# Patient Record
Sex: Male | Born: 1970 | Race: White | Hispanic: No | Marital: Married | Smoking: Current every day smoker
Health system: Southern US, Community
[De-identification: ages and names within clinical notes are randomized; demographics above are authoritative.]

## PROBLEM LIST (undated history)

## (undated) DIAGNOSIS — F4310 Post-traumatic stress disorder, unspecified: Secondary | ICD-10-CM

## (undated) DIAGNOSIS — M5134 Other intervertebral disc degeneration, thoracic region: Secondary | ICD-10-CM

## (undated) DIAGNOSIS — F909 Attention-deficit hyperactivity disorder, unspecified type: Secondary | ICD-10-CM

## (undated) DIAGNOSIS — M5136 Other intervertebral disc degeneration, lumbar region: Secondary | ICD-10-CM

## (undated) DIAGNOSIS — E079 Disorder of thyroid, unspecified: Secondary | ICD-10-CM

## (undated) DIAGNOSIS — G8929 Other chronic pain: Secondary | ICD-10-CM

## (undated) DIAGNOSIS — F319 Bipolar disorder, unspecified: Secondary | ICD-10-CM

## (undated) HISTORY — PX: BACK SURGERY: SHX140

## (undated) HISTORY — PX: KNEE SURGERY: SHX244

---

## 2013-02-13 ENCOUNTER — Encounter (HOSPITAL_COMMUNITY): Payer: Self-pay | Admitting: Emergency Medicine

## 2013-02-13 ENCOUNTER — Emergency Department (HOSPITAL_COMMUNITY)
Admission: EM | Admit: 2013-02-13 | Discharge: 2013-02-13 | Disposition: A | Discharge status: Home / Self Care | Payer: Worker's Compensation | Attending: Emergency Medicine | Admitting: Emergency Medicine

## 2013-02-13 DIAGNOSIS — G8929 Other chronic pain: Secondary | ICD-10-CM

## 2013-02-13 DIAGNOSIS — M549 Dorsalgia, unspecified: Secondary | ICD-10-CM | POA: Insufficient documentation

## 2013-02-13 DIAGNOSIS — F17200 Nicotine dependence, unspecified, uncomplicated: Secondary | ICD-10-CM | POA: Insufficient documentation

## 2013-02-13 DIAGNOSIS — M542 Cervicalgia: Secondary | ICD-10-CM | POA: Insufficient documentation

## 2013-02-13 DIAGNOSIS — R52 Pain, unspecified: Secondary | ICD-10-CM | POA: Insufficient documentation

## 2013-02-13 DIAGNOSIS — Z79899 Other long term (current) drug therapy: Secondary | ICD-10-CM | POA: Insufficient documentation

## 2013-02-13 DIAGNOSIS — Z880 Allergy status to penicillin: Secondary | ICD-10-CM | POA: Insufficient documentation

## 2013-02-13 HISTORY — DX: Other chronic pain: G89.29

## 2013-02-13 MED ORDER — OXYCODONE-ACETAMINOPHEN 5-325 MG PO TABS
2.0000 | ORAL_TABLET | Freq: Once | ORAL | Status: AC
  Administered 2013-02-13: 2 via ORAL
  Filled 2013-02-13: qty 2

## 2013-02-13 MED ORDER — IBUPROFEN 600 MG PO TABS: 600.0000 mg | ORAL_TABLET | Freq: Four times a day (QID) | ORAL | Status: DC | PRN

## 2013-02-13 MED ORDER — KETOROLAC TROMETHAMINE 60 MG/2ML IM SOLN
60.0000 mg | Freq: Once | INTRAMUSCULAR | Status: AC
  Administered 2013-02-13: 60 mg via INTRAMUSCULAR
  Filled 2013-02-13: qty 2

## 2013-02-13 MED ORDER — OXYCODONE-ACETAMINOPHEN 5-325 MG PO TABS: 2.0000 | ORAL_TABLET | ORAL | Status: DC | PRN

## 2013-02-13 MED ORDER — METHOCARBAMOL 500 MG PO TABS
500.0000 mg | ORAL_TABLET | Freq: Once | ORAL | Status: AC
  Administered 2013-02-13: 500 mg via ORAL
  Filled 2013-02-13: qty 1

## 2013-02-13 MED ORDER — METHOCARBAMOL 500 MG PO TABS: 500.0000 mg | ORAL_TABLET | Freq: Two times a day (BID) | ORAL | Status: DC
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
## 2013-02-13 NOTE — ED Notes (Signed)
Pt c/o lower back pain with pain in right hip and knee all chronic in nature

## 2013-04-11 ENCOUNTER — Emergency Department (HOSPITAL_COMMUNITY)
Admission: EM | Admit: 2013-04-11 | Discharge: 2013-04-11 | Disposition: A | Discharge status: Home / Self Care | Payer: Worker's Compensation | Attending: Emergency Medicine | Admitting: Emergency Medicine

## 2013-04-11 ENCOUNTER — Encounter (HOSPITAL_COMMUNITY): Payer: Self-pay | Admitting: Emergency Medicine

## 2013-04-11 DIAGNOSIS — Z880 Allergy status to penicillin: Secondary | ICD-10-CM | POA: Insufficient documentation

## 2013-04-11 DIAGNOSIS — R209 Unspecified disturbances of skin sensation: Secondary | ICD-10-CM | POA: Insufficient documentation

## 2013-04-11 DIAGNOSIS — M5416 Radiculopathy, lumbar region: Secondary | ICD-10-CM

## 2013-04-11 DIAGNOSIS — G8921 Chronic pain due to trauma: Secondary | ICD-10-CM | POA: Insufficient documentation

## 2013-04-11 DIAGNOSIS — F17200 Nicotine dependence, unspecified, uncomplicated: Secondary | ICD-10-CM | POA: Insufficient documentation

## 2013-04-11 DIAGNOSIS — M546 Pain in thoracic spine: Secondary | ICD-10-CM | POA: Insufficient documentation

## 2013-04-11 DIAGNOSIS — IMO0002 Reserved for concepts with insufficient information to code with codable children: Secondary | ICD-10-CM | POA: Insufficient documentation

## 2013-04-11 DIAGNOSIS — Z79899 Other long term (current) drug therapy: Secondary | ICD-10-CM | POA: Insufficient documentation

## 2013-04-11 DIAGNOSIS — M79609 Pain in unspecified limb: Secondary | ICD-10-CM | POA: Insufficient documentation

## 2013-04-11 MED ORDER — DIAZEPAM 5 MG PO TABS
5.0000 mg | ORAL_TABLET | Freq: Once | ORAL | Status: AC
  Administered 2013-04-11: 5 mg via ORAL
  Filled 2013-04-11: qty 1

## 2013-04-11 MED ORDER — DIAZEPAM 5 MG PO TABS: 5.0000 mg | ORAL_TABLET | Freq: Four times a day (QID) | ORAL | Status: DC | PRN

## 2013-04-11 MED ORDER — NAPROXEN 500 MG PO TABS: 500.0000 mg | ORAL_TABLET | Freq: Two times a day (BID) | ORAL | Status: DC

## 2013-04-11 MED ORDER — KETOROLAC TROMETHAMINE 60 MG/2ML IM SOLN
60.0000 mg | Freq: Once | INTRAMUSCULAR | Status: AC
  Administered 2013-04-11: 60 mg via INTRAMUSCULAR
  Filled 2013-04-11: qty 2

## 2013-04-11 MED ORDER — OXYCODONE-ACETAMINOPHEN 5-325 MG PO TABS: 1.0000 | ORAL_TABLET | ORAL | Status: DC | PRN

## 2013-04-11 MED ORDER — OXYCODONE-ACETAMINOPHEN 5-325 MG PO TABS
1.0000 | ORAL_TABLET | Freq: Once | ORAL | Status: AC
  Administered 2013-04-11: 1 via ORAL
  Filled 2013-04-11: qty 1

## 2013-04-11 NOTE — ED Notes (Signed)
Patient with chronic back pain, having muscles spasms getting worse.  Patient having more pain in right hip than the left.  Increasing neck pain.
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
## 2013-04-15 NOTE — ED Provider Notes (Signed)
Medical screening examination/treatment/procedure(s) were performed by non-physician practitioner and as supervising physician I was immediately available for consultation/collaboration.  EKG Interpretation   None      Kadden Osterhout, MD 04/15/13 1649 

## 2013-04-24 ENCOUNTER — Emergency Department (HOSPITAL_COMMUNITY)
Admission: EM | Admit: 2013-04-24 | Discharge: 2013-04-24 | Disposition: A | Discharge status: Home / Self Care | Payer: Worker's Compensation | Attending: Emergency Medicine | Admitting: Emergency Medicine

## 2013-04-24 ENCOUNTER — Encounter (HOSPITAL_COMMUNITY): Payer: Self-pay | Admitting: Emergency Medicine

## 2013-04-24 DIAGNOSIS — Z880 Allergy status to penicillin: Secondary | ICD-10-CM | POA: Insufficient documentation

## 2013-04-24 DIAGNOSIS — F17200 Nicotine dependence, unspecified, uncomplicated: Secondary | ICD-10-CM | POA: Insufficient documentation

## 2013-04-24 DIAGNOSIS — M549 Dorsalgia, unspecified: Secondary | ICD-10-CM

## 2013-04-24 DIAGNOSIS — G8929 Other chronic pain: Secondary | ICD-10-CM | POA: Insufficient documentation

## 2013-04-24 DIAGNOSIS — Z79899 Other long term (current) drug therapy: Secondary | ICD-10-CM | POA: Insufficient documentation

## 2013-04-24 MED ORDER — IBUPROFEN 600 MG PO TABS: 600.0000 mg | ORAL_TABLET | Freq: Four times a day (QID) | ORAL | Status: DC | PRN

## 2013-04-24 MED ORDER — KETOROLAC TROMETHAMINE 60 MG/2ML IM SOLN
60.0000 mg | Freq: Once | INTRAMUSCULAR | Status: AC
  Administered 2013-04-24: 60 mg via INTRAMUSCULAR
  Filled 2013-04-24: qty 2

## 2013-04-24 MED ORDER — CYCLOBENZAPRINE HCL 10 MG PO TABS: 10.0000 mg | ORAL_TABLET | Freq: Two times a day (BID) | ORAL | Status: DC | PRN

## 2013-04-24 MED ORDER — METHOCARBAMOL 500 MG PO TABS
500.0000 mg | ORAL_TABLET | Freq: Once | ORAL | Status: AC
  Administered 2013-04-24: 500 mg via ORAL
  Filled 2013-04-24: qty 1

## 2013-04-24 MED ORDER — ORPHENADRINE CITRATE 30 MG/ML IJ SOLN: 60.0000 mg | Freq: Two times a day (BID) | INTRAMUSCULAR | Status: DC

## 2013-04-24 NOTE — ED Provider Notes (Signed)
CSN: [id number]     Arrival date & time 04/24/13  1932 History   First MD Initiated Contact with Patient 04/24/13 2035     Chief Complaint  Patient presents with  . Back Pain  . Neck Pain   (Consider location/radiation/quality/duration/timing/severity/associated sxs/prior Treatment) HPI Onset was 2-3 days ago worsening, pain is overall chronic.  The pain is sharp, located to neck down to low back. Modifying factors: worse with movement, palpation.  Associated symptoms: no fever, no trouble urination.  Recent medical care: here in ED two weeks ago.   Past Medical History  Diagnosis Date  . Chronic pain    History reviewed. No pertinent past surgical history. No family history on file. History  Substance Use Topics  . Smoking status: Current Every Day Smoker  . Smokeless tobacco: Not on file  . Alcohol Use: Yes     Comment: occ    Review of Systems Constitutional: Negative for fever.  Eyes: Negative for vision loss.  ENT: Negative for difficulty swallowing.  Cardiovascular: Negative for chest pain. Respiratory: Negative for respiratory distress.  Gastrointestinal:  Negative for vomiting.  Genitourinary: Negative for inability to void.  Musculoskeletal: Negative for gait problem.  Integumentary: Negative for rash.  Neurological: Negative for new focal weakness.     Allergies  Penicillins  Home Medications   Current Outpatient Rx  Name  Route  Sig  Dispense  Refill  . amphetamine-dextroamphetamine (ADDERALL XR) 30 MG 24 hr capsule   Oral   Take 30 mg by mouth every morning.         . butalbital-acetaminophen-caffeine (FIORICET, ESGIC) 50-325-40 MG per tablet   Oral   Take 1 tablet by mouth 2 (two) times daily as needed for headache.         . diazepam (VALIUM) 5 MG tablet   Oral   Take 1 tablet (5 mg total) by mouth every 6 (six) hours as needed for muscle spasms.   15 tablet   0   . gabapentin (NEURONTIN) 800 MG tablet   Oral   Take 800 mg by mouth 3  (three) times daily.         Marland Kitchen ibuprofen (ADVIL,MOTRIN) 600 MG tablet   Oral   Take 1 tablet (600 mg total) by mouth every 6 (six) hours as needed for pain.   30 tablet   0   . Ibuprofen-Diphenhydramine Cit (ADVIL PM PO)   Oral   Take 2 tablets by mouth at bedtime as needed.         . naproxen (NAPROSYN) 500 MG tablet   Oral   Take 1 tablet (500 mg total) by mouth 2 (two) times daily.   30 tablet   0   . oxyCODONE-acetaminophen (PERCOCET) 5-325 MG per tablet   Oral   Take 1 tablet by mouth every 4 (four) hours as needed for severe pain.   20 tablet   0   . cyclobenzaprine (FLEXERIL) 10 MG tablet   Oral   Take 1 tablet (10 mg total) by mouth 2 (two) times daily as needed for muscle spasms.   20 tablet   0   . ibuprofen (ADVIL,MOTRIN) 600 MG tablet   Oral   Take 1 tablet (600 mg total) by mouth every 6 (six) hours as needed.   30 tablet   0    BP 133/86  Pulse 70  Temp(Src) 98.4 F (36.9 C) (Oral)  Resp 16  Ht 6\' 1"  (1.854 m)  Wt 214 lb  8 oz (97.297 kg)  BMI 28.31 kg/m2  SpO2 99% Physical Exam Nursing note and vitals reviewed.  Constitutional: Pt is alert and appears stated age. Eyes: No injection, no scleral icterus. HENT: Atraumatic, airway open without erythema or exudate.  Respiratory: No respiratory distress. Equal breathing bilaterally. Cardiovascular: Normal rate. Extremities warm and well perfused.  Abdomen: Soft, non-tender. MSK: Extremities are atraumatic without deformity. Skin: No rash, no wounds.   Neuro: No motor nor sensory deficit. GCS 15. Normal coordination.      ED Course  Procedures (including critical care time) Labs Review Labs Reviewed - No data to display Imaging Review No results found.  EKG Interpretation   None       MDM   1. Back pain    42 y.o. male w/ PMHx of chronic pain presents w/ back pain exacerbation. No back pain red flags. No deficits on exam. Similar presentation to two weeks ago without any new  injury. Do not feel imaging or work up indicated. Pt given toradol, flexeril. Stressed importance of PCP follow up. Counseling provided regarding diagnosis, treatment plan, follow up recommendations, and return precautions. Questions answered.       I independently viewed, interpreted, and used in my medical decision making all ordered lab and imaging tests. Medical Decision Making discussed with ED attending Enid Skeens, MD      Charm Barges, MD 04/24/13 2150

## 2013-04-24 NOTE — ED Notes (Signed)
Pt states he has an old injury from work, and about every month he has pain in his neck that radiates down his back to his hips. Pt states he has some weakness. Pt was ambulatory to room. Pt states he moved here from [place] and has been unable to find a physician. Pt rates pain 9/10.

## 2013-04-24 NOTE — ED Notes (Signed)
Dr. Gregary Cromer at bedside.

## 2013-04-24 NOTE — ED Notes (Signed)
Pt. Reports pain at back of neck radiating down to lower back for 3 days unrelieved by OTC pain medications  , denies injury or fall , ambulatory .

## 2013-04-24 NOTE — ED Notes (Signed)
Discharge instructions reviewed. Pt verbalized understanding.  

## 2013-04-25 NOTE — ED Provider Notes (Signed)
   Medical screening examination/treatment/procedure(s) were conducted as a shared visit with non-physician practitioner(s) or resident and myself. I personally evaluated the patient during the encounter and agree with the findings and plan unless otherwise indicated.  I have personally reviewed any xrays and/ or EKG's with the provider and I agree with interpretation.  Neck and back pain for years, similar to previous, no new injuries. No weakness or fevers. Exam mild tender paraspinal cervical and lumbar, nl extremity strength/ sensation at major joints. Nl LE reflexes. Well appearing otherwise. Discussed close fup outpt and reasons to return. Pain meds and muscle relaxants in ED.  Chronic neck and back pain, muscle spasms     Enid Skeens, MD 04/25/13 [phone]

## 2013-06-04 ENCOUNTER — Emergency Department (HOSPITAL_COMMUNITY)
Admission: EM | Admit: 2013-06-04 | Discharge: 2013-06-04 | Discharge status: Against Medical Advice | Payer: PRIVATE HEALTH INSURANCE | Attending: Emergency Medicine | Admitting: Emergency Medicine

## 2013-06-04 ENCOUNTER — Encounter (HOSPITAL_COMMUNITY): Payer: Self-pay | Admitting: Emergency Medicine

## 2013-06-04 ENCOUNTER — Emergency Department (HOSPITAL_COMMUNITY): Payer: PRIVATE HEALTH INSURANCE

## 2013-06-04 ENCOUNTER — Emergency Department (HOSPITAL_COMMUNITY)
Admission: EM | Admit: 2013-06-04 | Discharge: 2013-06-04 | Disposition: A | Discharge status: Home / Self Care | Payer: PRIVATE HEALTH INSURANCE | Attending: Emergency Medicine | Admitting: Emergency Medicine

## 2013-06-04 DIAGNOSIS — R296 Repeated falls: Secondary | ICD-10-CM | POA: Insufficient documentation

## 2013-06-04 DIAGNOSIS — S6990XA Unspecified injury of unspecified wrist, hand and finger(s), initial encounter: Secondary | ICD-10-CM | POA: Insufficient documentation

## 2013-06-04 DIAGNOSIS — Z880 Allergy status to penicillin: Secondary | ICD-10-CM | POA: Insufficient documentation

## 2013-06-04 DIAGNOSIS — F17200 Nicotine dependence, unspecified, uncomplicated: Secondary | ICD-10-CM | POA: Insufficient documentation

## 2013-06-04 DIAGNOSIS — W1809XA Striking against other object with subsequent fall, initial encounter: Secondary | ICD-10-CM | POA: Insufficient documentation

## 2013-06-04 DIAGNOSIS — G8929 Other chronic pain: Secondary | ICD-10-CM | POA: Insufficient documentation

## 2013-06-04 DIAGNOSIS — S79919A Unspecified injury of unspecified hip, initial encounter: Secondary | ICD-10-CM | POA: Insufficient documentation

## 2013-06-04 DIAGNOSIS — Y92009 Unspecified place in unspecified non-institutional (private) residence as the place of occurrence of the external cause: Secondary | ICD-10-CM | POA: Insufficient documentation

## 2013-06-04 DIAGNOSIS — Y939 Activity, unspecified: Secondary | ICD-10-CM | POA: Insufficient documentation

## 2013-06-04 DIAGNOSIS — S60221A Contusion of right hand, initial encounter: Secondary | ICD-10-CM

## 2013-06-04 DIAGNOSIS — Z79899 Other long term (current) drug therapy: Secondary | ICD-10-CM | POA: Insufficient documentation

## 2013-06-04 DIAGNOSIS — W19XXXA Unspecified fall, initial encounter: Secondary | ICD-10-CM

## 2013-06-04 DIAGNOSIS — Y929 Unspecified place or not applicable: Secondary | ICD-10-CM | POA: Insufficient documentation

## 2013-06-04 DIAGNOSIS — S60229A Contusion of unspecified hand, initial encounter: Secondary | ICD-10-CM | POA: Insufficient documentation

## 2013-06-04 MED ORDER — OXYCODONE-ACETAMINOPHEN 5-325 MG PO TABS
1.0000 | ORAL_TABLET | Freq: Once | ORAL | Status: AC
  Administered 2013-06-04: 1 via ORAL
  Filled 2013-06-04: qty 1

## 2013-06-04 MED ORDER — OXYCODONE-ACETAMINOPHEN 5-325 MG PO TABS: 1.0000 | ORAL_TABLET | ORAL | Status: AC | PRN

## 2013-06-04 NOTE — ED Notes (Signed)
Fall with c/o right hand pain; right hip pain and states hit right side of head; neg loc; neck pain

## 2013-06-04 NOTE — ED Provider Notes (Signed)
4:01 AM went to go to the patient's room for first evaluation and patient had apparently walked out. I never saw or evaluated the patient.  Audree Camel, MD 06/04/13 [phone]

## 2013-06-04 NOTE — ED Provider Notes (Signed)
CSN: [id number]     Arrival date & time 06/04/13  3474 History   First MD Initiated Contact with Patient 06/04/13 0700     Chief Complaint  Patient presents with  . Hand Injury   (Consider location/radiation/quality/duration/timing/severity/associated sxs/prior Treatment) Patient is a 42 y.o. male presenting with hand injury. The history is provided by the patient.  Hand Injury He states that he fell about 5 PM yesterday and landed on his right hand. He is complaining of pain and swelling right hand. Pain is moderate to severe and he rates it an 8/10. It is worse with movement and palpation. Nothing makes it better. He denies other injury.  Past Medical History  Diagnosis Date  . Chronic pain    History reviewed. No pertinent past surgical history. History reviewed. No pertinent family history. History  Substance Use Topics  . Smoking status: Current Every Day Smoker  . Smokeless tobacco: Not on file  . Alcohol Use: Yes     Comment: occ    Review of Systems  All other systems reviewed and are negative.    Allergies  Penicillins  Home Medications   Current Outpatient Rx  Name  Route  Sig  Dispense  Refill  . gabapentin (NEURONTIN) 800 MG tablet   Oral   Take 800 mg by mouth 3 (three) times daily.         Marland Kitchen amphetamine-dextroamphetamine (ADDERALL XR) 30 MG 24 hr capsule   Oral   Take 30 mg by mouth every morning.         . butalbital-acetaminophen-caffeine (FIORICET, ESGIC) 50-325-40 MG per tablet   Oral   Take 1 tablet by mouth 2 (two) times daily as needed for headache.         . Ibuprofen-Diphenhydramine Cit (ADVIL PM PO)   Oral   Take 2 tablets by mouth at bedtime as needed.         Marland Kitchen oxyCODONE-acetaminophen (PERCOCET/ROXICET) 5-325 MG per tablet   Oral   Take 1 tablet by mouth every 4 (four) hours as needed for severe pain.   20 tablet   0    BP 152/76  Pulse 82  Temp(Src) 97.6 F (36.4 C) (Oral)  Resp 16  Ht 6\' 1"  (1.854 m)  Wt 218 lb  (98.884 kg)  BMI 28.77 kg/m2  SpO2 97% Physical Exam  Nursing note and vitals reviewed.  42 year old male, resting comfortably and in no acute distress. Vital signs are significant for hypertension with blood pressure 152/76. Oxygen saturation is 97%, which is normal. Head is normocephalic and atraumatic. PERRLA, EOMI. Oropharynx is clear. Neck is nontender and supple without adenopathy or JVD. Back is nontender and there is no CVA tenderness. Lungs are clear without rales, wheezes, or rhonchi. Chest is nontender. Heart has regular rate and rhythm without murmur. Abdomen is soft, flat, nontender without masses or hepatosplenomegaly and peristalsis is normoactive. Extremities: Right hand has mild swelling over the third, fourth, fifth MCP joints with tenderness palpation throughout that whole area. There is pain with any passive movement of the wrist or fingers. Distal neurovascular exam is intact with prompt capillary refill normal sensation.. Skin is warm and dry without rash. Neurologic: Mental status is normal, cranial nerves are intact, there are no motor or sensory deficits.  ED Course  Procedures (including critical care time) Labs Review Labs Reviewed - No data to display Imaging Review Dg Hand Complete Right  06/04/2013   CLINICAL DATA:  Larey Seat on right hand, with  right hand pain and swelling.  EXAM: RIGHT HAND - COMPLETE 3+ VIEW  COMPARISON:  None.  FINDINGS: There is no evidence of acute fracture or dislocation. A chronically displaced ulnar styloid fragment is seen. The joint spaces are preserved; the soft tissues are unremarkable in appearance. The carpal rows are intact, and demonstrate normal alignment.  IMPRESSION: No evidence of acute fracture or dislocation.   Electronically Signed   By: Roanna Raider M.D.   On: 06/04/2013 05:56    EKG Interpretation   None       MDM   1. Fall at home, initial encounter   2. Contusion of right hand, initial encounter     Contusion of right hand. X-ray showed no evidence of fracture. He is placed in a wrist splint for comfort and as instructed in ice and elevation. Prescription is given for oxycodone-acetaminophen for pain.    [name], MD 06/04/13 [phone]

## 2013-06-04 NOTE — ED Notes (Addendum)
Patient punched wall and then left ED without being seen by MD Dr. Criss Alvine aware

## 2013-06-04 NOTE — ED Notes (Signed)
Patient reports fell on hand yesterday evening. Complaining of pain and swelling to right hand.

## 2014-12-19 IMAGING — CR DG HAND COMPLETE 3+V*R*
3 series · 3 of 3 positions shown · non-contrast
Comparison: None.

CLINICAL DATA: Fell on right hand, with right hand pain and
swelling.

EXAM:
RIGHT HAND - COMPLETE 3+ VIEW

[view not recorded (1 of 3)]
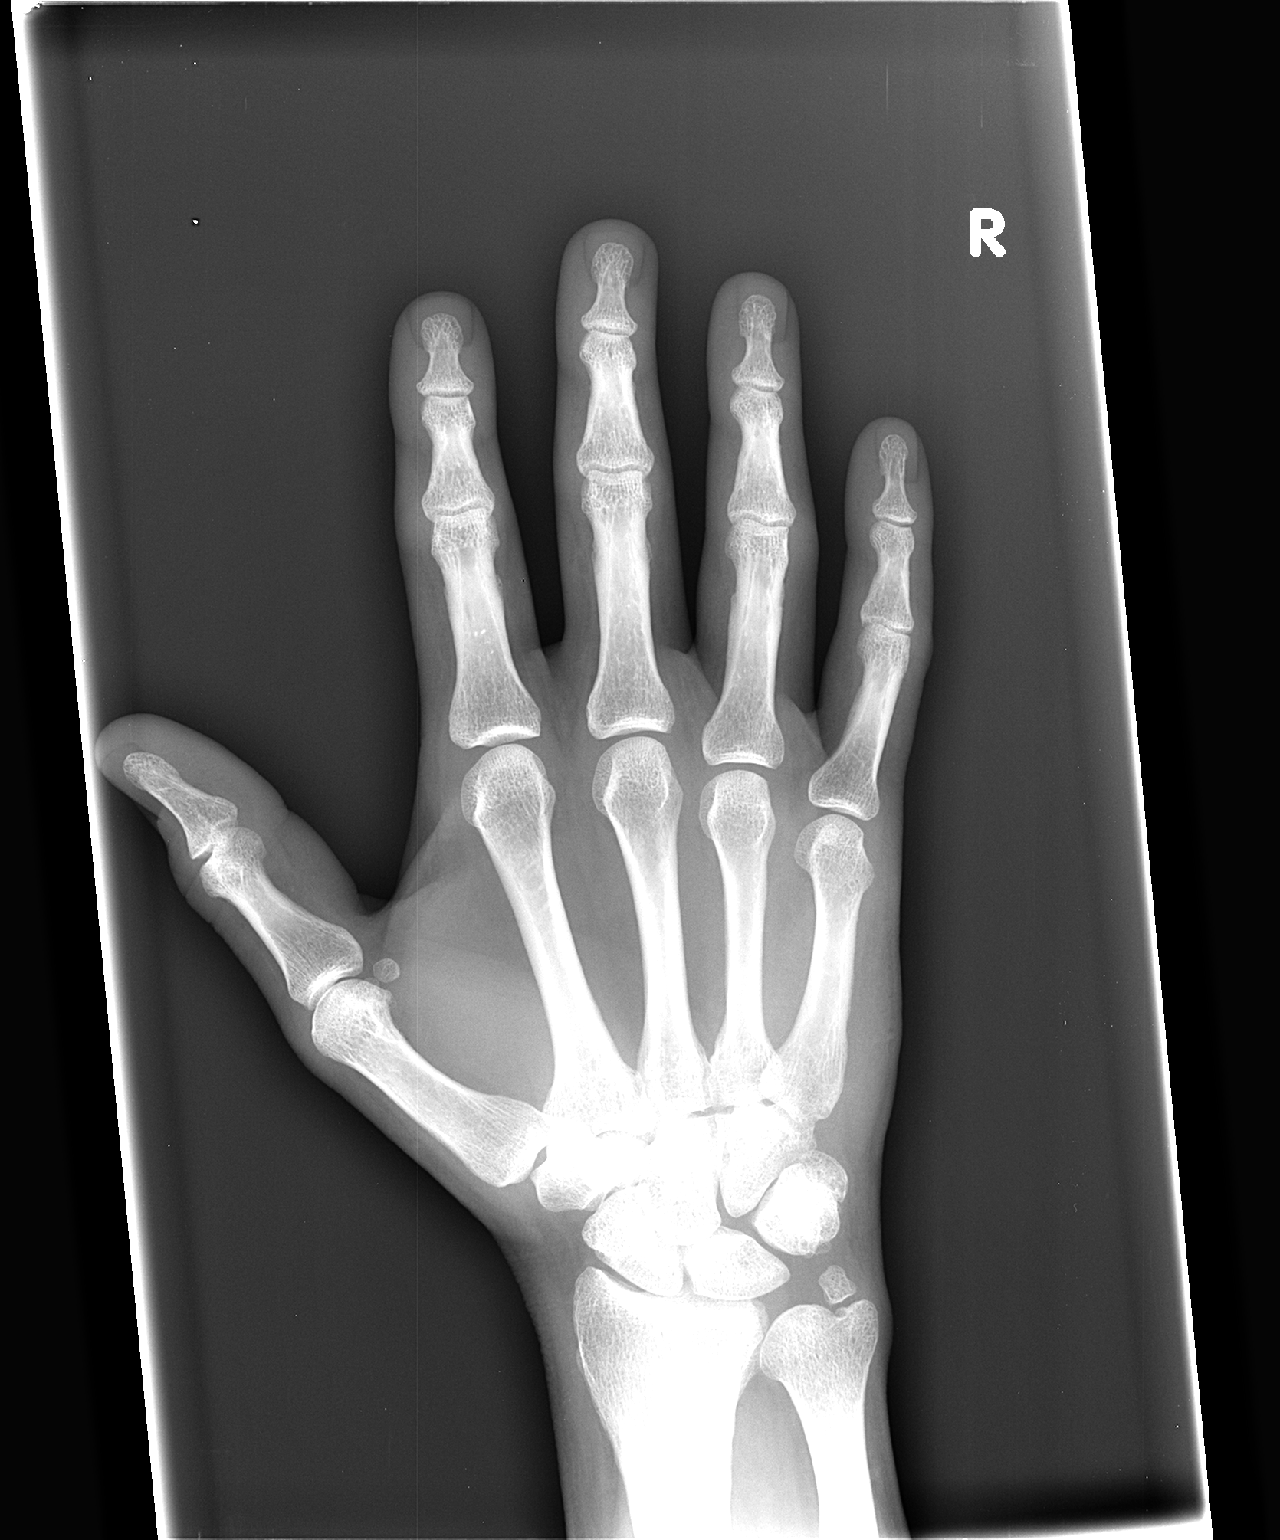

[view not recorded (2 of 3)]
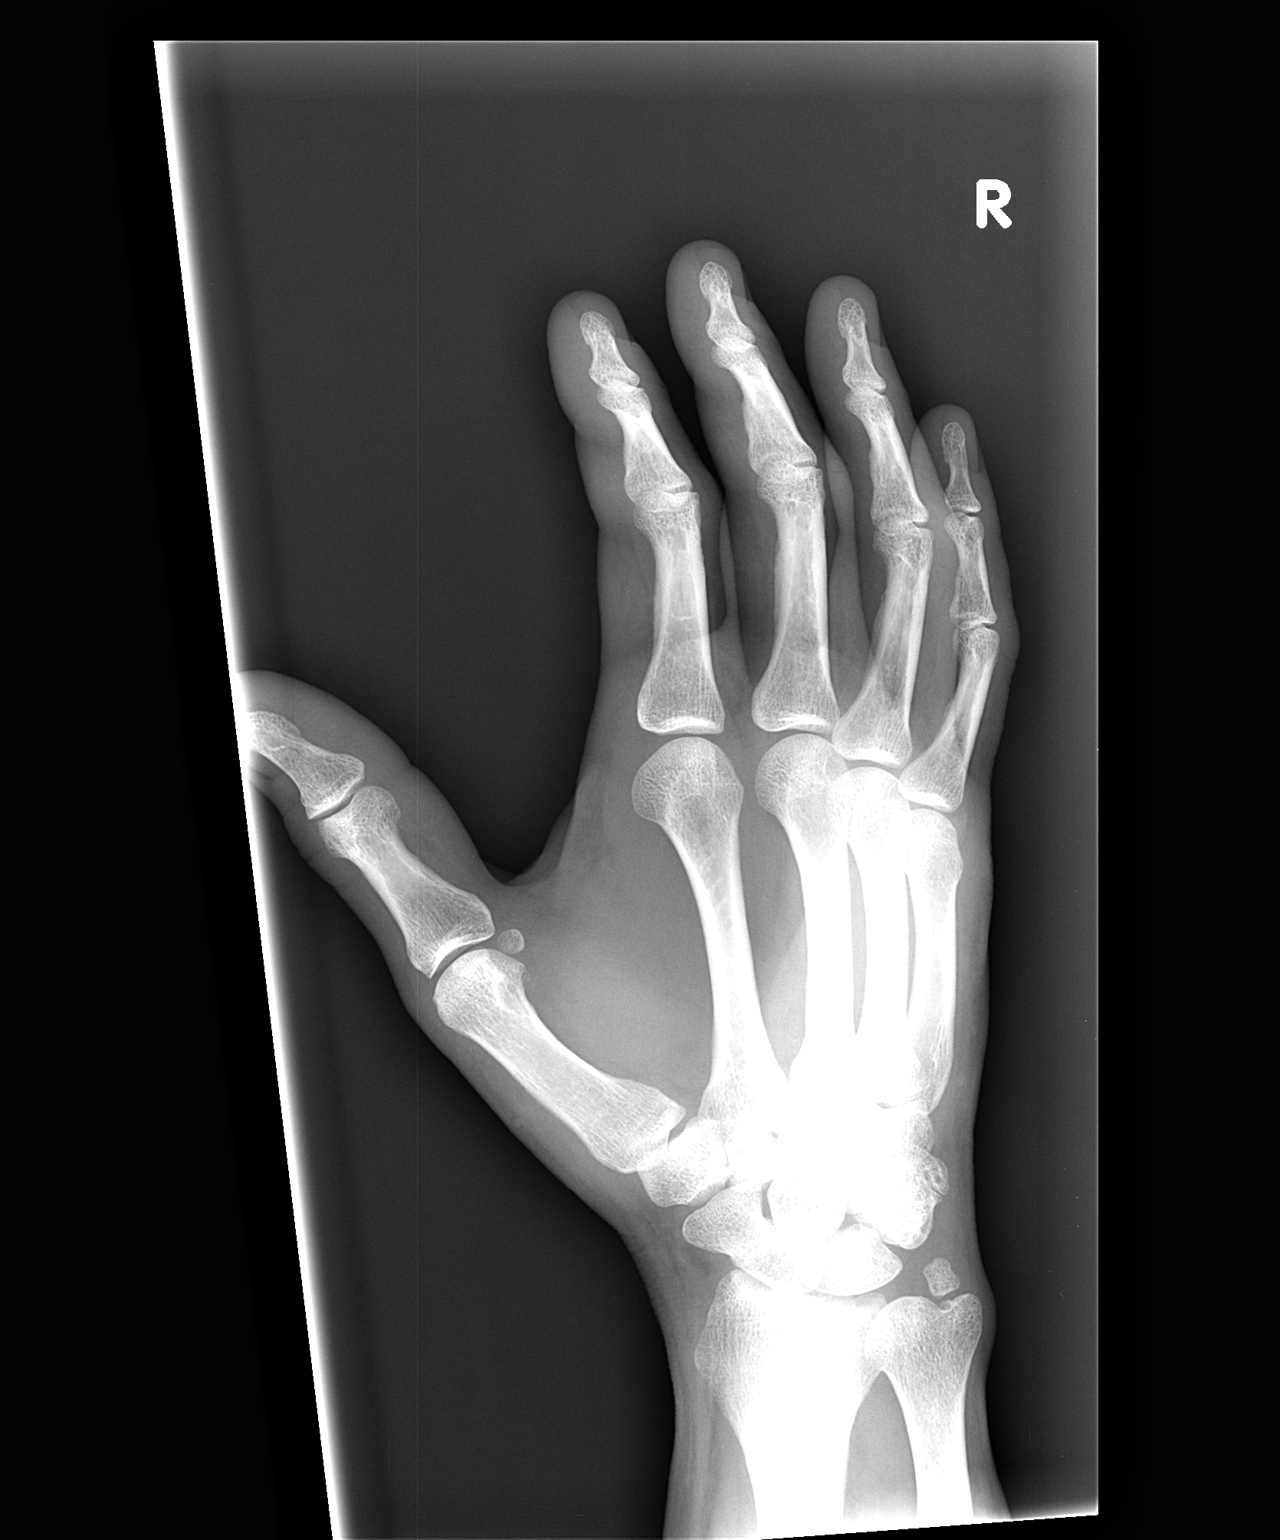

[view not recorded (3 of 3)]
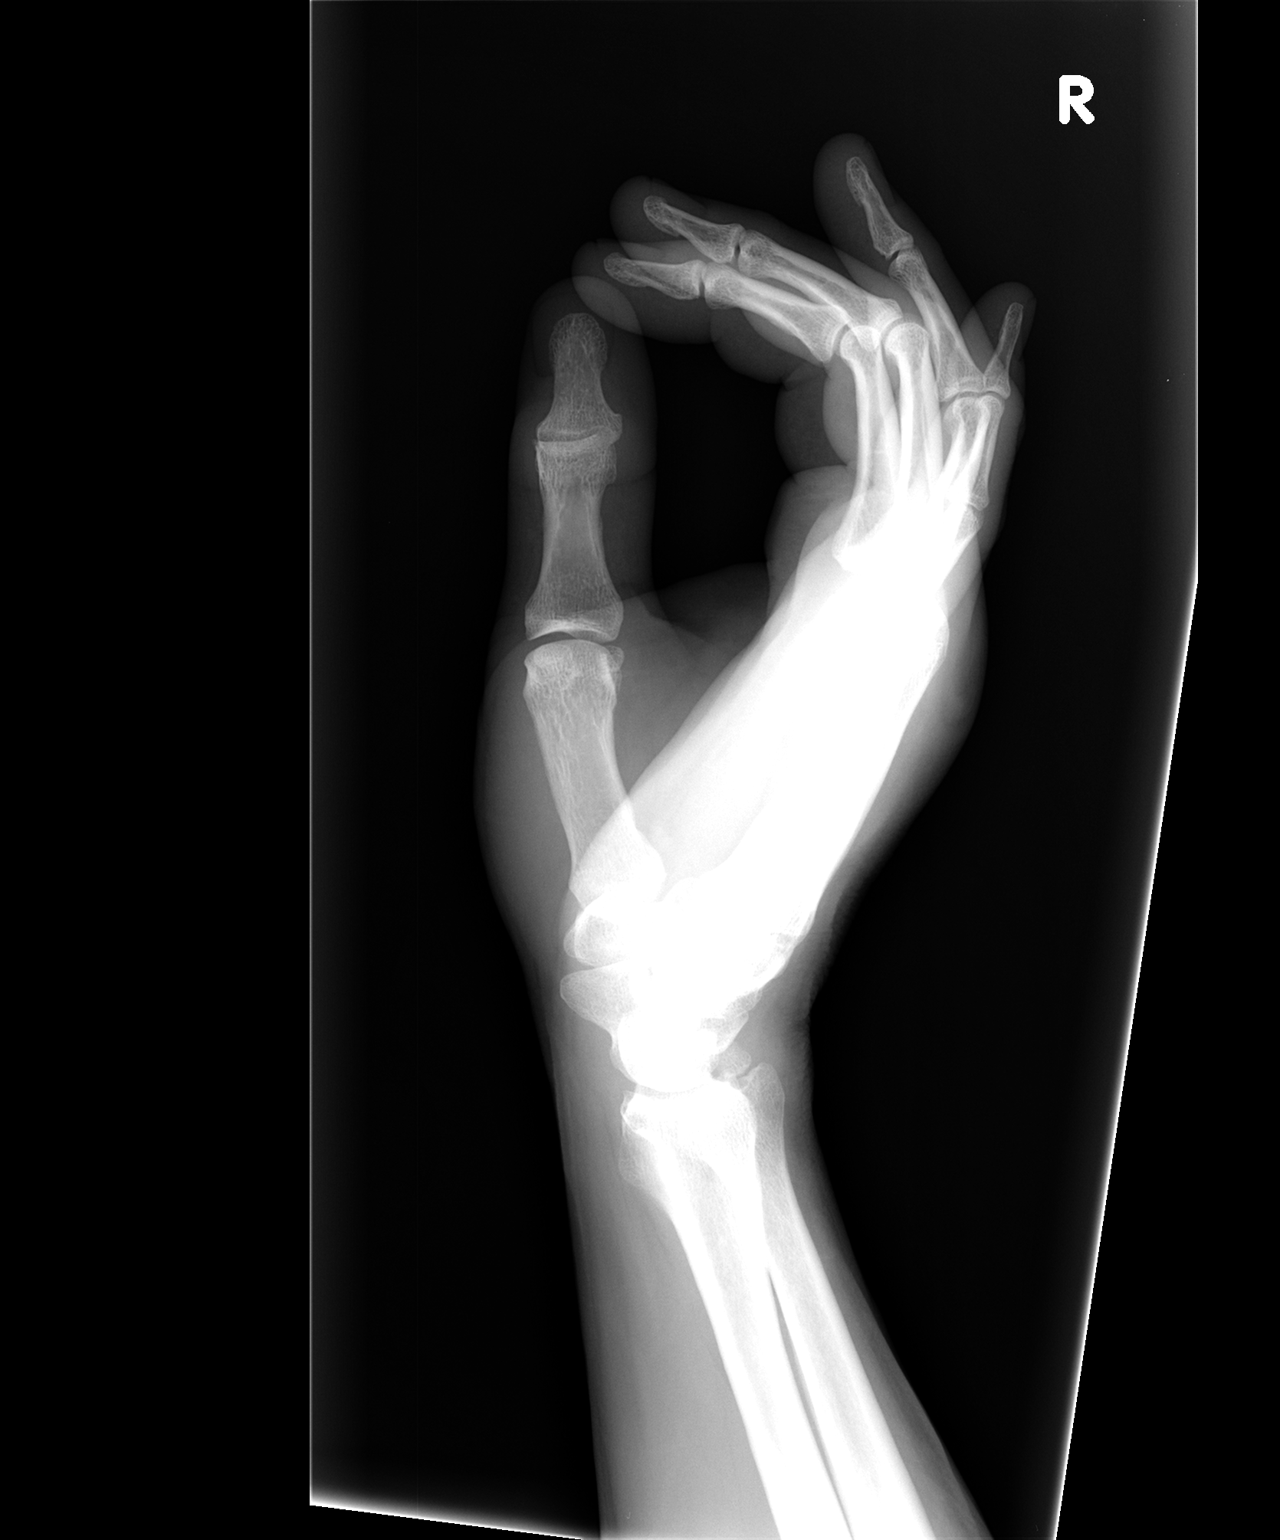

[3 of 3 positions shown; findings below may reference images not displayed]

FINDINGS: There is no evidence of acute fracture or dislocation. A chronically
displaced ulnar styloid fragment is seen. The joint spaces are
preserved; the soft tissues are unremarkable in appearance. The
carpal rows are intact, and demonstrate normal alignment.
IMPRESSION: No evidence of acute fracture or dislocation.

## 2016-05-08 ENCOUNTER — Encounter (HOSPITAL_COMMUNITY): Payer: Self-pay | Admitting: *Deleted

## 2016-05-08 ENCOUNTER — Emergency Department (HOSPITAL_COMMUNITY): Payer: Medicare Other

## 2016-05-08 ENCOUNTER — Emergency Department (HOSPITAL_COMMUNITY)
Admission: EM | Admit: 2016-05-08 | Discharge: 2016-05-08 | Disposition: A | Discharge status: Home / Self Care | Payer: Medicare Other | Attending: Emergency Medicine | Admitting: Emergency Medicine

## 2016-05-08 DIAGNOSIS — Y939 Activity, unspecified: Secondary | ICD-10-CM | POA: Insufficient documentation

## 2016-05-08 DIAGNOSIS — Y929 Unspecified place or not applicable: Secondary | ICD-10-CM | POA: Insufficient documentation

## 2016-05-08 DIAGNOSIS — Z79899 Other long term (current) drug therapy: Secondary | ICD-10-CM | POA: Insufficient documentation

## 2016-05-08 DIAGNOSIS — X509XXA Other and unspecified overexertion or strenuous movements or postures, initial encounter: Secondary | ICD-10-CM | POA: Diagnosis not present

## 2016-05-08 DIAGNOSIS — F909 Attention-deficit hyperactivity disorder, unspecified type: Secondary | ICD-10-CM | POA: Insufficient documentation

## 2016-05-08 DIAGNOSIS — F17200 Nicotine dependence, unspecified, uncomplicated: Secondary | ICD-10-CM | POA: Insufficient documentation

## 2016-05-08 DIAGNOSIS — M2392 Unspecified internal derangement of left knee: Secondary | ICD-10-CM

## 2016-05-08 DIAGNOSIS — M25562 Pain in left knee: Secondary | ICD-10-CM | POA: Diagnosis present

## 2016-05-08 DIAGNOSIS — Y999 Unspecified external cause status: Secondary | ICD-10-CM | POA: Diagnosis not present

## 2016-05-08 HISTORY — DX: Post-traumatic stress disorder, unspecified: F43.10

## 2016-05-08 HISTORY — DX: Attention-deficit hyperactivity disorder, unspecified type: F90.9

## 2016-05-08 HISTORY — DX: Bipolar disorder, unspecified: F31.9

## 2016-05-08 HISTORY — DX: Other intervertebral disc degeneration, lumbar region: M51.36

## 2016-05-08 HISTORY — DX: Other intervertebral disc degeneration, thoracic region: M51.34

## 2016-05-08 MED ORDER — HYDROCODONE-ACETAMINOPHEN 5-325 MG PO TABS
1.0000 | ORAL_TABLET | Freq: Once | ORAL | Status: AC
  Administered 2016-05-08: 1 via ORAL
  Filled 2016-05-08: qty 1

## 2016-05-08 MED ORDER — OXYCODONE-ACETAMINOPHEN 5-325 MG PO TABS: 1.0000 | ORAL_TABLET | ORAL | 0 refills | Status: AC | PRN

## 2016-05-08 NOTE — ED Provider Notes (Signed)
[facility]-EMERGENCY DEPT Provider Note   CSN: [id number] Arrival date & time: 05/08/16  1811  By signing my name below, I, [name], attest that this documentation has been prepared under the direction and in the presence of Jason Ellis, [place]. Electronically Signed: [name], ED Scribe. 05/08/2016. 6:47 PM.    History   Chief Complaint Chief Complaint  Patient presents with  . Knee Pain   The history is provided by the patient. No language interpreter was used.   HPI Comments:  Jason Ellis is a 45 y.o. male who presents to the Emergency Department s/p left knee injury that occurred today. Pt reports that he was at [employer] and bent over to get a box, and he felt a pop in his knee and fell to the ground immediately. Pt reports 4 previous knee surgeries on right side. Pt states that he is unable to bear weight on his left leg, and states that the pain radiates up and down his left leg. Tetanus is UTD. No alleviating factors noted. Pt denies other associated symptoms.   Past Medical History:  Diagnosis Date  . ADHD   . Bipolar 1 disorder (HCC)   . Chronic pain   . Degenerative disc disease, lumbar   . Degenerative disc disease, thoracic   . PTSD (post-traumatic stress disorder)     There are no active problems to display for this patient.   Past Surgical History:  Procedure Laterality Date  . KNEE SURGERY Right    4 on right knee       Home Medications    Prior to Admission medications   Medication Sig Start Date End Date Taking? Authorizing Provider  amphetamine-dextroamphetamine (ADDERALL XR) 30 MG 24 hr capsule Take 30 mg by mouth every morning.    Historical Provider, MD  butalbital-acetaminophen-caffeine (FIORICET, ESGIC) 50-325-40 MG per tablet Take 1 tablet by mouth 2 (two) times daily as needed for headache.    Historical Provider, MD  gabapentin (NEURONTIN) 800 MG tablet Take 800 mg by mouth 3 (three) times daily.    Historical Provider, MD    Ibuprofen-Diphenhydramine Cit (ADVIL PM PO) Take 2 tablets by mouth at bedtime as needed.    Historical Provider, MD  oxyCODONE-acetaminophen (PERCOCET/ROXICET) 5-325 MG per tablet Take 1 tablet by mouth every 4 (four) hours as needed for severe pain. 06/04/13   [name], MD  oxyCODONE-acetaminophen (PERCOCET/ROXICET) 5-325 MG tablet Take 1-2 tablets by mouth every 4 (four) hours as needed for severe pain. 05/08/16   Jason Crigler, PA-C    Family History No family history on file.  Social History Social History  Substance Use Topics  . Smoking status: Current Every Day Smoker  . Smokeless tobacco: Never Used  . Alcohol use Yes     Comment: occ     Allergies   Hydromorphone and Penicillins   Review of Systems Review of Systems  Constitutional: Negative for activity change and fever.  Gastrointestinal: Negative for nausea and vomiting.  Musculoskeletal: Positive for arthralgias, gait problem and joint swelling. Negative for back pain and neck pain.  Skin: Negative for wound.  Neurological: Negative for weakness and numbness.     Physical Exam Updated Vital Signs BP (!) 154/107   Pulse 80   Temp 97.8 F (36.6 C) (Oral)   Resp 16   SpO2 99%   Physical Exam  Constitutional: He appears well-developed and well-nourished. No distress.  HENT:  Head: Normocephalic and atraumatic.  Eyes: Conjunctivae are normal.  Neck:  Normal range of motion. Neck supple.  Cardiovascular: Normal rate and normal pulses.  Exam reveals no decreased pulses.   Pulmonary/Chest: Effort normal.  Abdominal: He exhibits no distension.  Musculoskeletal: He exhibits tenderness. He exhibits no edema.       Left hip: Normal.       Left knee: He exhibits decreased range of motion, swelling and effusion. He exhibits no ecchymosis. Tenderness found. Medial joint line and lateral joint line tenderness noted.       Left ankle: Normal.       Left lower leg: He exhibits tenderness. He exhibits no bony  tenderness, no swelling and no edema.       Left foot: Normal.  Neurological: He is alert. No sensory deficit.  Motor, sensation, and vascular distal to the injury is fully intact.   Skin: Skin is warm and dry.  Psychiatric: He has a normal mood and affect.  Nursing note and vitals reviewed.    ED Treatments / Results  DIAGNOSTIC STUDIES:  Oxygen Saturation is 100% on RA, normal by my interpretation.    COORDINATION OF CARE:  6:47 PM Will order x-ray. Discussed treatment plan with pt at bedside and pt agreed to plan.   Radiology Dg Knee Complete 4 Views Left  Result Date: 05/08/2016 CLINICAL DATA:  Generalized knee pain. Felt and heard a pop in the left knee. EXAM: LEFT KNEE - COMPLETE 4+ VIEW COMPARISON:  None. FINDINGS: The femorotibial and patellofemoral compartments are maintained and aligned. No acute fracture is seen. Old Osgood-Schlatter's with ossification off the tibial tuberosity is noted vs old distal patellar tendon injury. Probable small ill-defined suprapatellar joint effusion. IMPRESSION: Probable joint effusion. Evidence of old Osgood-Schlatter's of the tibial tuberosity vs old patellar tendon injury. No acute osseous abnormality. Electronically Signed   By: Tollie Ethavid  Kwon M.D.   On: 05/08/2016 19:45    Procedures Procedures (including critical care time)  Medications Ordered in ED Medications  HYDROcodone-acetaminophen (NORCO/VICODIN) 5-325 MG per tablet 1 tablet (1 tablet Oral Given 05/08/16 1900)     Initial Impression / Assessment and Plan / ED Course  I have reviewed the triage vital signs and the nursing notes.  Pertinent labs & imaging results that were available during my care of the patient were reviewed by me and considered in my medical decision making (see chart for details).  Clinical Course    Patient seen and examined. Work-up initiated. Medications ordered.   Vital signs reviewed and are as follows: BP (!) 154/107   Pulse 80   Temp 97.8 F  (36.6 C) (Oral)   Resp 16   SpO2 99%   Patient updated on x-ray results. Discussed that he will need follow-up with an orthopedist for further evaluation and management. Home with pain medication, knee immobilizer, crutches.  Patient counseled on use of narcotic pain medications. Counseled not to combine these medications with others containing tylenol. Urged not to drink alcohol, drive, or perform any other activities that requires focus while taking these medications. The patient verbalizes understanding and agrees with the plan.  Discussed not to combine Percocet with Klonopin which he takes as needed. Patient aware of this potentially dangerous interaction.   Final Clinical Impressions(s) / ED Diagnoses   Final diagnoses:  Internal derangement of left knee    New Prescriptions Discharge Medication List as of 05/08/2016  8:28 PM    START taking these medications   Details  !! oxyCODONE-acetaminophen (PERCOCET/ROXICET) 5-325 MG tablet Take 1-2 tablets by mouth every  4 (four) hours as needed for severe pain., Starting Fri 05/08/2016, Print     !! - Potential duplicate medications found. Please discuss with provider.    I personally performed the services described in this documentation, which was scribed in my presence. The recorded information has been reviewed and is accurate.    Jason CriglerJoshua Develle Sievers, PA-C 05/08/16 2115    Nira ConnPedro Eduardo Cardama, MD 05/10/16 [id number]

## 2016-05-08 NOTE — ED Triage Notes (Signed)
Patient is escorted by EMS.  Patient states he twisted his left knee and felt a pop.  He has swelling and pain all around his knee.  EMS stated distal pulses were good.  He has pain with movement of knee.   BP: 168/100 HR:84 R:16

## 2016-05-08 NOTE — ED Triage Notes (Signed)
Deformity noted on pt's left knee.  Pt reports pain and is unable to move knee.

## 2016-05-08 NOTE — Discharge Instructions (Signed)
Please read and follow all provided instructions.  Your diagnoses today include:  1. Internal derangement of left knee     Tests performed today include:  An x-ray of the affected area - does NOT show any broken bones, shows swelling in the joint  Vital signs. See below for your results today.   Medications prescribed:   Percocet (oxycodone/acetaminophen) - narcotic pain medication  DO NOT drive or perform any activities that require you to be awake and alert because this medicine can make you drowsy. BE VERY CAREFUL not to take multiple medicines containing Tylenol (also called acetaminophen). Doing so can lead to an overdose which can damage your liver and cause liver failure and possibly death.  Take any prescribed medications only as directed.  Home care instructions:    Follow any educational materials contained in this packet  Follow R.I.C.E. Protocol:  R - rest your injury   I  - use ice on injury without applying directly to skin  C - compress injury with bandage or splint  E - elevate the injury as much as possible  Follow-up instructions: Please follow-up with the orthopedist listed. See resource guide for primary care information.   Return instructions:   Please return if your toes or feet are numb or tingling, appear gray or blue, or you have severe pain (also elevate the leg and loosen splint or wrap if you were given one)  Please return to the Emergency Department if you experience worsening symptoms.   Please return if you have any other emergent concerns.  Additional Information:  Your vital signs today were: BP 156/100 (BP Location: Left Arm)    Pulse 87    Temp 97.8 F (36.6 C) (Oral)    Resp 16    SpO2 96%  If your blood pressure (BP) was elevated above 135/85 this visit, please have this repeated by your doctor within one month. --------------

## 2016-07-12 ENCOUNTER — Encounter (HOSPITAL_COMMUNITY): Payer: Self-pay | Admitting: Emergency Medicine

## 2016-07-12 ENCOUNTER — Emergency Department (HOSPITAL_COMMUNITY)
Admission: EM | Admit: 2016-07-12 | Discharge: 2016-07-12 | Disposition: A | Discharge status: Home / Self Care | Payer: Medicare Other | Attending: Emergency Medicine | Admitting: Emergency Medicine

## 2016-07-12 DIAGNOSIS — M25512 Pain in left shoulder: Secondary | ICD-10-CM | POA: Insufficient documentation

## 2016-07-12 DIAGNOSIS — F17210 Nicotine dependence, cigarettes, uncomplicated: Secondary | ICD-10-CM | POA: Insufficient documentation

## 2016-07-12 DIAGNOSIS — F909 Attention-deficit hyperactivity disorder, unspecified type: Secondary | ICD-10-CM | POA: Insufficient documentation

## 2016-07-12 HISTORY — DX: Disorder of thyroid, unspecified: E07.9

## 2016-07-12 MED ORDER — KETOROLAC TROMETHAMINE 30 MG/ML IJ SOLN
30.0000 mg | Freq: Once | INTRAMUSCULAR | Status: AC
  Administered 2016-07-12: 30 mg via INTRAMUSCULAR
  Filled 2016-07-12: qty 1

## 2016-07-12 MED ORDER — METHOCARBAMOL 500 MG PO TABS: 500.0000 mg | ORAL_TABLET | Freq: Two times a day (BID) | ORAL | 0 refills | Status: AC | PRN

## 2016-07-12 NOTE — ED Triage Notes (Signed)
Patient c/o left shoulder pain that hurts worse with movement that radiates to back since yesterday.  Patient states that he been applying heat on and off.  Patient doesn't remember lifting or moving anything to cause the pain. Patient reports standing position feels better.

## 2016-07-12 NOTE — ED Provider Notes (Signed)
[facility]-EMERGENCY DEPT Provider Note   CSN: [id number] Arrival date & time: 07/12/16  1837  By signing my name below, I, Elder NegusRussell Johnston, attest that this documentation has been prepared under the direction and in the presence of Everlene FarrierWilliam Tyniesha Howald, [place]. Electronically Signed: Elder Negusussell Johnston, Scribe. 07/12/16. 8:17 PM.   History   Chief Complaint Chief Complaint  Patient presents with  . Shoulder Pain  . Back Pain    HPI Jason Ellis is a 46 y.o. male with history of chronic back pain who presents to the ED for evaluation of L shoulder pain. This patient states that yesterday he first noticed L shoulder pain primarily over the scapular area which extends to the superior aspect of the shoulder. Today his pain worsened and now becomes worse with moving his shoulder and deep inspiration. He has been applying heating pads without relief. He denies any weaknesses, decreased sensation, or paresthesias. Denies any neck pain. Reports chronic back pain which is unchanged in character. He denies fevers, cough, SOB, wheezing, injury, numbness, tingling, arm pain, weakness, chest pain, or rashes. He has no other complaints at this time.   The history is provided by the patient. No language interpreter was used.    Past Medical History:  Diagnosis Date  . ADHD   . Bipolar 1 disorder (HCC)   . Chronic pain   . Degenerative disc disease, lumbar   . Degenerative disc disease, thoracic   . PTSD (post-traumatic stress disorder)   . Thyroid disease     There are no active problems to display for this patient.   Past Surgical History:  Procedure Laterality Date  . BACK SURGERY    . KNEE SURGERY Right    4 on right knee       Home Medications    Prior to Admission medications   Medication Sig Start Date End Date Taking? Authorizing Provider  amphetamine-dextroamphetamine (ADDERALL XR) 30 MG 24 hr capsule Take 30 mg by mouth every morning.    Historical Provider, MD    butalbital-acetaminophen-caffeine (FIORICET, ESGIC) 50-325-40 MG per tablet Take 1 tablet by mouth 2 (two) times daily as needed for headache.    Historical Provider, MD  gabapentin (NEURONTIN) 800 MG tablet Take 800 mg by mouth 3 (three) times daily.    Historical Provider, MD  Ibuprofen-Diphenhydramine Cit (ADVIL PM PO) Take 2 tablets by mouth at bedtime as needed.    Historical Provider, MD  methocarbamol (ROBAXIN) 500 MG tablet Take 1 tablet (500 mg total) by mouth 2 (two) times daily as needed for muscle spasms. 07/12/16   Everlene FarrierWilliam Valli Randol, PA-C  oxyCODONE-acetaminophen (PERCOCET/ROXICET) 5-325 MG per tablet Take 1 tablet by mouth every 4 (four) hours as needed for severe pain. 06/04/13   Dione Boozeavid Glick, MD  oxyCODONE-acetaminophen (PERCOCET/ROXICET) 5-325 MG tablet Take 1-2 tablets by mouth every 4 (four) hours as needed for severe pain. 05/08/16   Renne CriglerJoshua Geiple, PA-C    Family History No family history on file.  Social History Social History  Substance Use Topics  . Smoking status: Current Every Day Smoker    Types: Cigarettes  . Smokeless tobacco: Never Used  . Alcohol use Yes     Comment: occ     Allergies   Hydromorphone and Penicillins   Review of Systems Review of Systems  Constitutional: Negative for chills and fever.  Respiratory: Negative for cough, shortness of breath and wheezing.   Cardiovascular: Negative for chest pain.  Musculoskeletal: Negative for back pain, neck pain and neck stiffness.  L shoulder pain.  Skin: Negative for rash and wound.  Neurological: Negative for weakness and numbness.       No paresthesias.      Physical Exam Updated Vital Signs BP 128/83 (BP Location: Right Arm)   Pulse 81   Temp 97.7 F (36.5 C) (Oral)   Resp 16   Ht 6\' 1"  (1.854 m)   Wt 108.9 kg   SpO2 99%   BMI 31.66 kg/m   Physical Exam  Constitutional: He is oriented to person, place, and time. He appears well-developed and well-nourished. No distress.  Nontoxic  appearing.  HENT:  Head: Normocephalic and atraumatic.  Eyes: Conjunctivae are normal. Pupils are equal, round, and reactive to light. Right eye exhibits no discharge. Left eye exhibits no discharge.  Neck: Neck supple.  Cardiovascular: Normal rate, regular rhythm, normal heart sounds and intact distal pulses.   Pulses:      Radial pulses are 2+ on the right side, and 2+ on the left side.  Bilateral radial pulses are intact.  Pulmonary/Chest: Effort normal and breath sounds normal. No respiratory distress. He has no wheezes. He has no rales. He exhibits no tenderness.  Lungs good auscultation bilaterally. No increased work of breathing. No rales or rhonchi.  Abdominal: Soft. There is no tenderness.  Musculoskeletal: Normal range of motion. He exhibits tenderness. He exhibits no edema or deformity.       Back:  Patient has point tenderness over his left rhomboid musculature. Good strength in his bilateral upper extremities. Good grip strength bilaterally. No upper extremity edema noted. No overlying skin changes to his back. No midline back tenderness. No back erythema, deformity, or ecchymosis.  Lymphadenopathy:    He has no cervical adenopathy.  Neurological: He is alert and oriented to person, place, and time. No sensory deficit. Coordination normal.  Skin: Skin is warm and dry. Capillary refill takes less than 2 seconds. No rash noted. He is not diaphoretic. No erythema. No pallor.  Psychiatric: He has a normal mood and affect. His behavior is normal.  Nursing note and vitals reviewed.    ED Treatments / Results  DIAGNOSTIC STUDIES: Oxygen Saturation is 99 percent on room air which is normal by my interpretation.    COORDINATION OF CARE: 8:16 PM Discussed treatment plan with pt at bedside and pt agreed to plan.  Labs (all labs ordered are listed, but only abnormal results are displayed) Labs Reviewed - No data to display  EKG  EKG Interpretation None       Radiology No  results found.  Procedures Procedures (including critical care time)  Medications Ordered in ED Medications  ketorolac (TORADOL) 30 MG/ML injection 30 mg (not administered)     Initial Impression / Assessment and Plan / ED Course  I have reviewed the triage vital signs and the nursing notes.  Pertinent labs & imaging results that were available during my care of the patient were reviewed by me and considered in my medical decision making (see chart for details).    Patient sensed the emergency room complaining of atraumatic left shoulder pain. On exam patient is afebrile nontoxic appearing. He has point tenderness overlying his left trapezius musculature which reproduces his pain. Lungs clear to auscultation bilaterally. He is neurovascularly intact. He denies any chest pain, shortness of breath, numbness, tingling, weakness or neck pain. Patient appears to have musculoskeletal pain. ER he takes an NSAID twice daily as prescribed by his orthopedic surgeon. We'll provide him with a shot of  Toradol and a prescription for Levoxine to use at home. I encouraged ice and massage to this area. I discussed strict and specific return precautions. I advised if he develops fevers, coughing, trouble breathing, or chest pain he needs to return to the emergency department for reevaluation. I have low suspicion for ACS or other concerning pathology at this time. I advised the patient to follow-up with their primary care provider this week. I advised the patient to return to the emergency department with new or worsening symptoms or new concerns. The patient verbalized understanding and agreement with plan.     Final Clinical Impressions(s) / ED Diagnoses   Final diagnoses:  Acute pain of left shoulder    New Prescriptions New Prescriptions   METHOCARBAMOL (ROBAXIN) 500 MG TABLET    Take 1 tablet (500 mg total) by mouth 2 (two) times daily as needed for muscle spasms.   I personally performed the  services described in this documentation, which was scribed in my presence. The recorded information has been reviewed and is accurate.      Everlene Farrier, PA-C 07/12/16 2040    Loren Racer, MD 07/15/16 [phone]

## 2017-11-22 IMAGING — CR DG KNEE COMPLETE 4+V*L*
4 series · 4 of 4 positions shown · non-contrast
Comparison: None.

CLINICAL DATA: Generalized knee pain. Felt and heard a pop in the
left knee.

EXAM:
LEFT KNEE - COMPLETE 4+ VIEW

[t knee ap left]
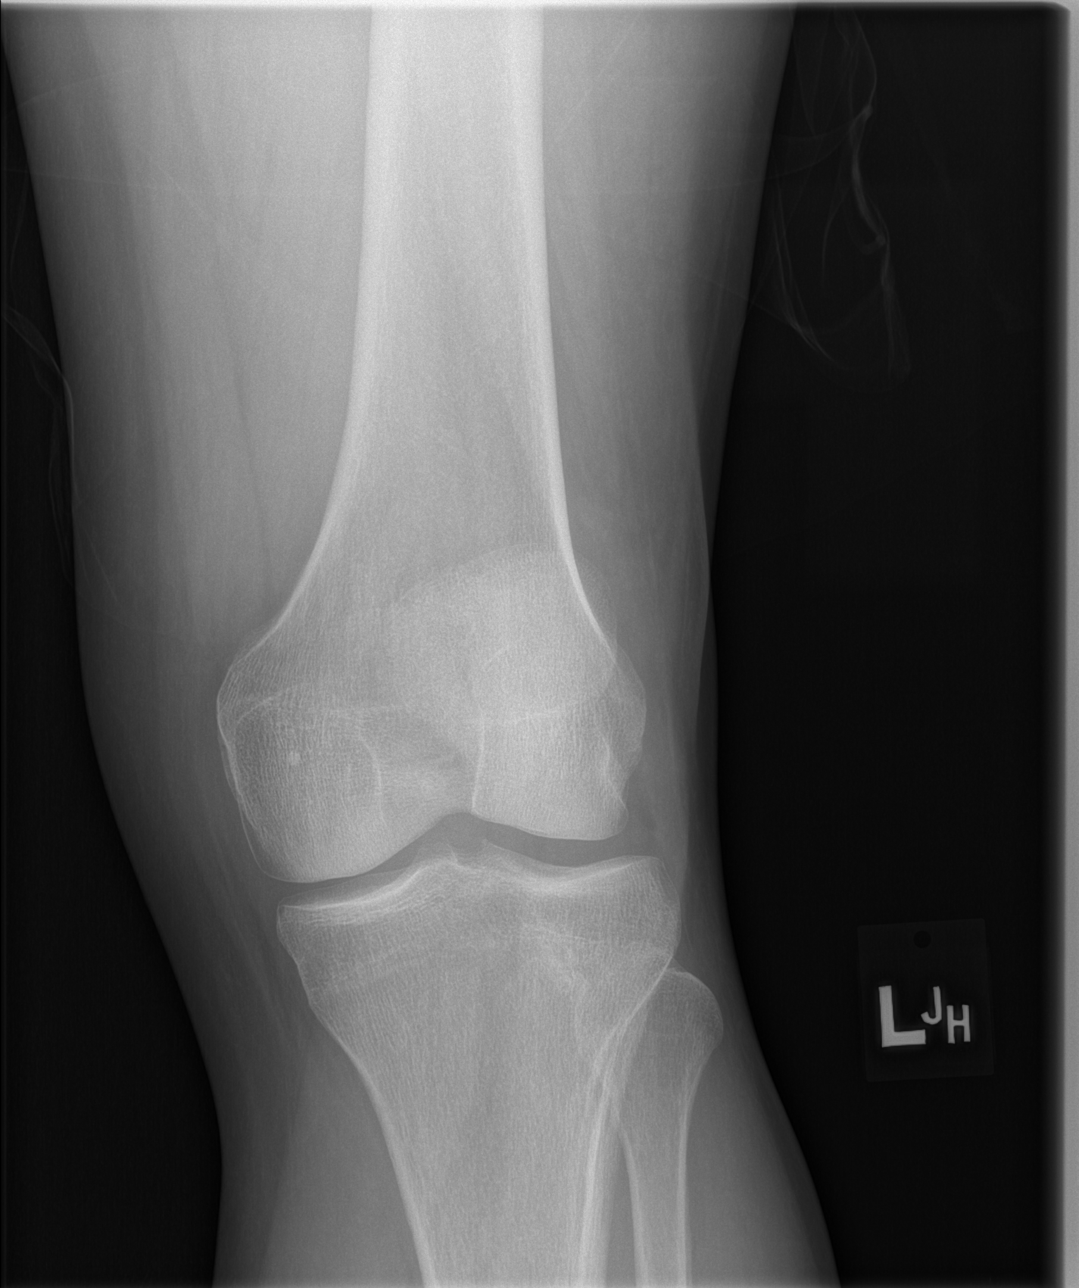

[t knee obl left (1 of 2)]
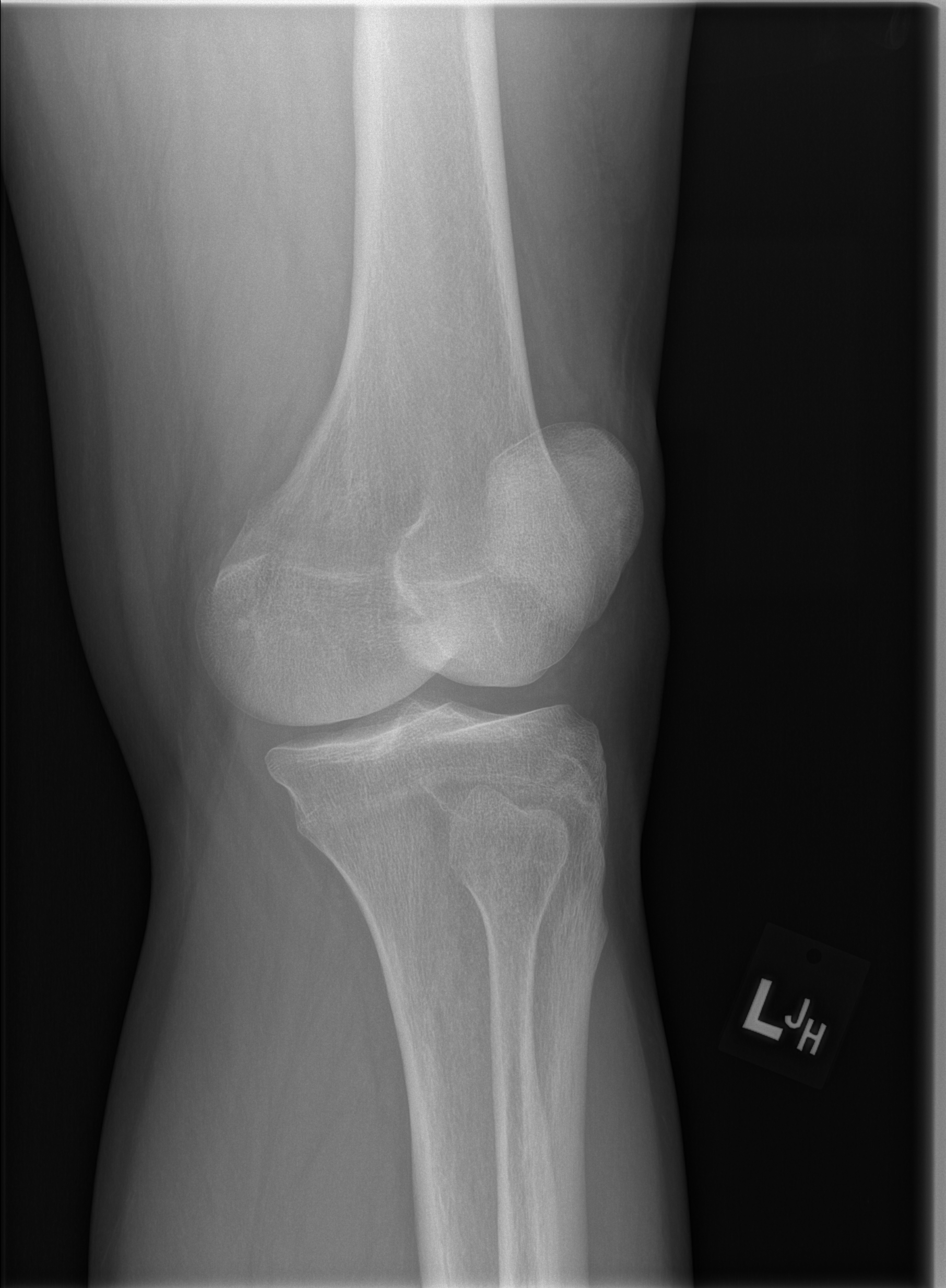

[t knee obl left (2 of 2)]
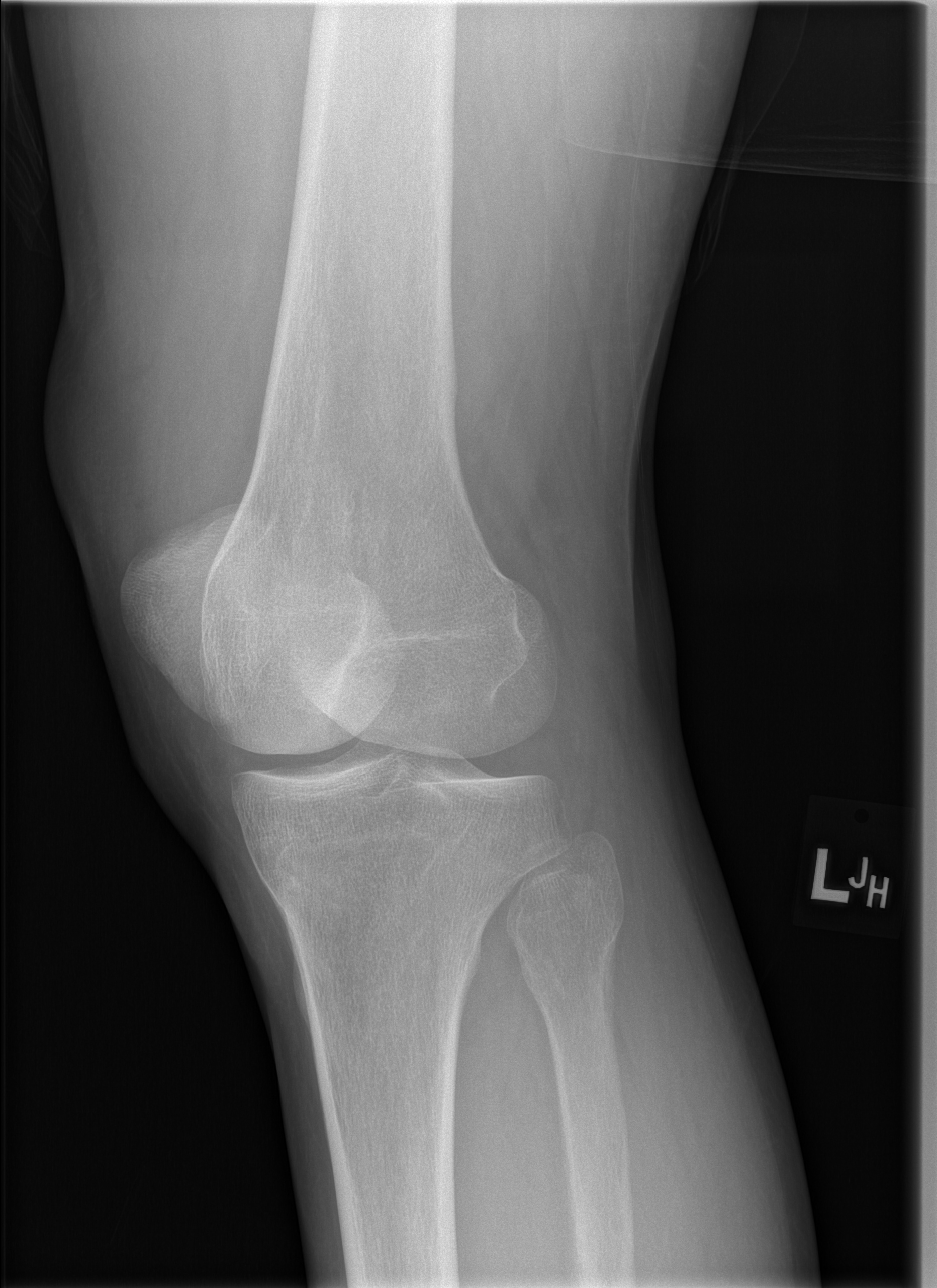

[t knee lat left]
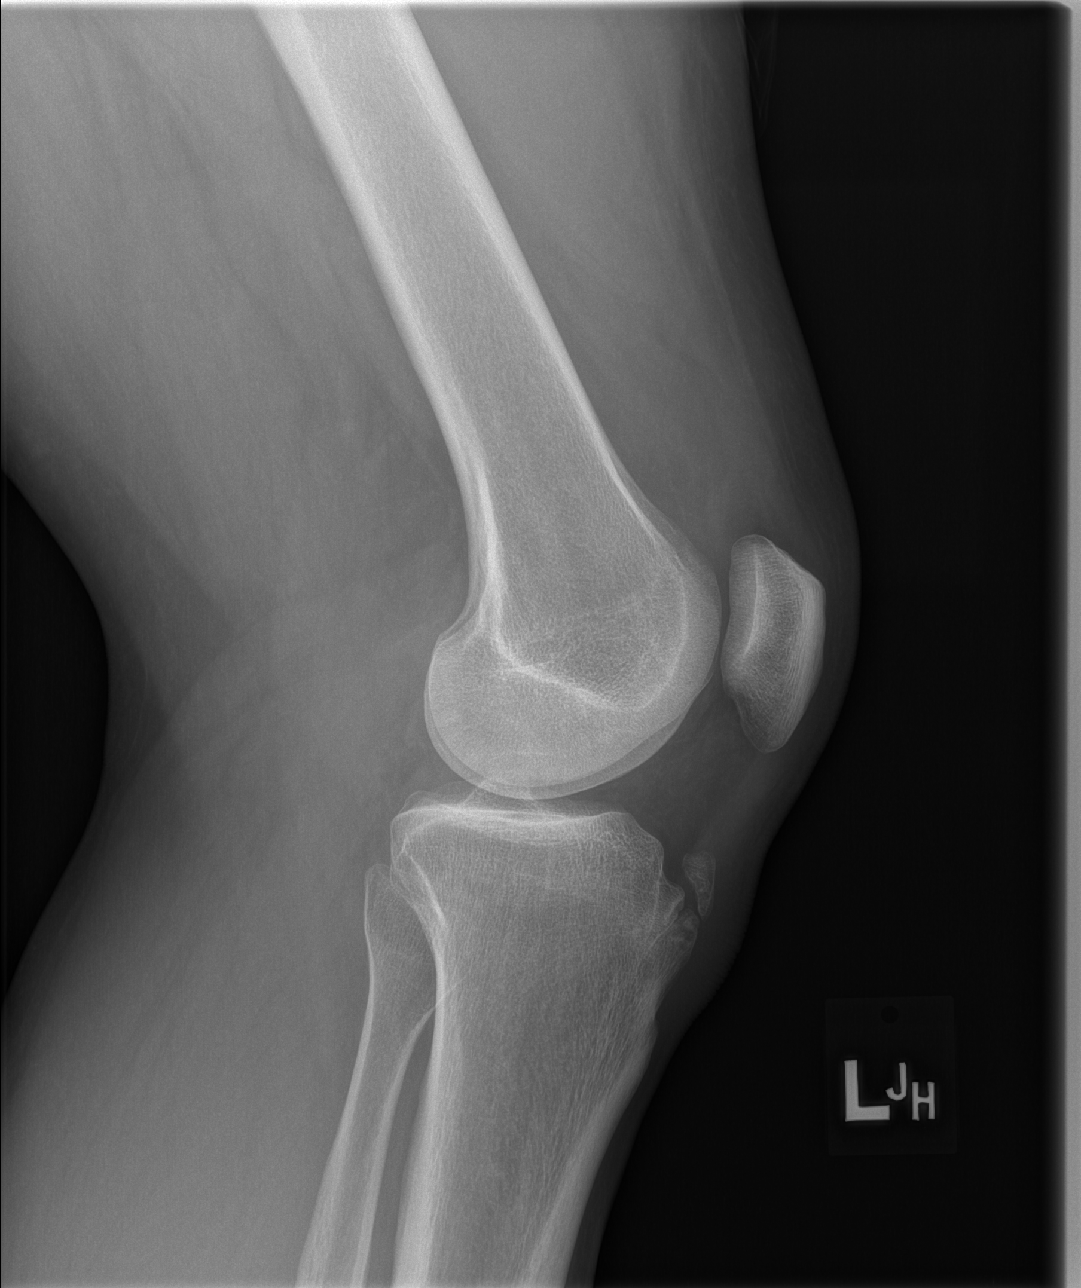

[4 of 4 positions shown; findings below may reference images not displayed]

FINDINGS: The femorotibial and patellofemoral compartments are maintained and
aligned. No acute fracture is seen. Old Osgood-Schlatter's with
ossification off the tibial tuberosity is noted vs old distal
patellar tendon injury. Probable small ill-defined suprapatellar
joint effusion.
IMPRESSION: Probable joint effusion. Evidence of old Osgood-Schlatter's of the
tibial tuberosity vs old patellar tendon injury. No acute osseous
abnormality.
